# Patient Record
Sex: Female | Born: 1970 | Race: Black or African American | Hispanic: No | Marital: Single | Smoking: Never smoker
Health system: Southern US, Community
[De-identification: ages and names within clinical notes are randomized; demographics above are authoritative.]

## PROBLEM LIST (undated history)

## (undated) DIAGNOSIS — M549 Dorsalgia, unspecified: Secondary | ICD-10-CM

## (undated) DIAGNOSIS — K219 Gastro-esophageal reflux disease without esophagitis: Secondary | ICD-10-CM

## (undated) DIAGNOSIS — E039 Hypothyroidism, unspecified: Secondary | ICD-10-CM

## (undated) HISTORY — PX: FINGER TENDON REPAIR: SHX1640

## (undated) HISTORY — PX: ABDOMINAL HYSTERECTOMY: SHX81

## (undated) HISTORY — PX: SHOULDER ARTHROSCOPY W/ SUPERIOR LABRAL ANTERIOR POSTERIOR LESION REPAIR: SHX2402

---

## 2021-05-16 ENCOUNTER — Observation Stay (HOSPITAL_BASED_OUTPATIENT_CLINIC_OR_DEPARTMENT_OTHER)
Admission: EM | Admit: 2021-05-16 | Discharge: 2021-05-18 | Disposition: A | Discharge status: Home / Self Care | Attending: Surgery | Admitting: Surgery

## 2021-05-16 ENCOUNTER — Emergency Department (HOSPITAL_BASED_OUTPATIENT_CLINIC_OR_DEPARTMENT_OTHER)

## 2021-05-16 ENCOUNTER — Encounter (HOSPITAL_BASED_OUTPATIENT_CLINIC_OR_DEPARTMENT_OTHER): Payer: Self-pay | Admitting: *Deleted

## 2021-05-16 ENCOUNTER — Other Ambulatory Visit: Payer: Self-pay

## 2021-05-16 DIAGNOSIS — Z20822 Contact with and (suspected) exposure to covid-19: Secondary | ICD-10-CM | POA: Diagnosis not present

## 2021-05-16 DIAGNOSIS — E039 Hypothyroidism, unspecified: Secondary | ICD-10-CM | POA: Diagnosis not present

## 2021-05-16 DIAGNOSIS — R1030 Lower abdominal pain, unspecified: Secondary | ICD-10-CM | POA: Diagnosis present

## 2021-05-16 DIAGNOSIS — K37 Unspecified appendicitis: Secondary | ICD-10-CM | POA: Diagnosis present

## 2021-05-16 DIAGNOSIS — K3580 Unspecified acute appendicitis: Principal | ICD-10-CM | POA: Diagnosis present

## 2021-05-16 DIAGNOSIS — K3589 Other acute appendicitis without perforation or gangrene: Secondary | ICD-10-CM

## 2021-05-16 HISTORY — DX: Gastro-esophageal reflux disease without esophagitis: K21.9

## 2021-05-16 HISTORY — DX: Hypothyroidism, unspecified: E03.9

## 2021-05-16 HISTORY — DX: Dorsalgia, unspecified: M54.9

## 2021-05-16 LAB — URINALYSIS, ROUTINE W REFLEX MICROSCOPIC
Bilirubin Urine: NEGATIVE
Glucose, UA: NEGATIVE mg/dL
Ketones, ur: NEGATIVE mg/dL
Nitrite: NEGATIVE
Protein, ur: NEGATIVE mg/dL
Specific Gravity, Urine: 1.025 (ref 1.005–1.030)
pH: 6 (ref 5.0–8.0)

## 2021-05-16 LAB — CBC
HCT: 41 % (ref 36.0–46.0)
Hemoglobin: 13.3 g/dL (ref 12.0–15.0)
MCH: 25.6 pg — ABNORMAL LOW (ref 26.0–34.0)
MCHC: 32.4 g/dL (ref 30.0–36.0)
MCV: 79 fL — ABNORMAL LOW (ref 80.0–100.0)
Platelets: 354 10*3/uL (ref 150–400)
RBC: 5.19 MIL/uL — ABNORMAL HIGH (ref 3.87–5.11)
RDW: 14.8 % (ref 11.5–15.5)
WBC: 18.6 10*3/uL — ABNORMAL HIGH (ref 4.0–10.5)
nRBC: 0 % (ref 0.0–0.2)

## 2021-05-16 LAB — COMPREHENSIVE METABOLIC PANEL
ALT: 14 U/L (ref 0–44)
AST: 18 U/L (ref 15–41)
Albumin: 4.2 g/dL (ref 3.5–5.0)
Alkaline Phosphatase: 106 U/L (ref 38–126)
Anion gap: 8 (ref 5–15)
BUN: 10 mg/dL (ref 6–20)
CO2: 26 mmol/L (ref 22–32)
Calcium: 9.4 mg/dL (ref 8.9–10.3)
Chloride: 103 mmol/L (ref 98–111)
Creatinine, Ser: 0.96 mg/dL (ref 0.44–1.00)
GFR, Estimated: >60 mL/min (ref >60)
Glucose, Bld: 122 mg/dL — ABNORMAL HIGH (ref 70–99)
Potassium: 4.1 mmol/L (ref 3.5–5.1)
Sodium: 137 mmol/L (ref 135–145)
Total Bilirubin: 0.4 mg/dL (ref 0.3–1.2)
Total Protein: 8.7 g/dL — ABNORMAL HIGH (ref 6.5–8.1)

## 2021-05-16 LAB — URINALYSIS, MICROSCOPIC (REFLEX)

## 2021-05-16 LAB — RESP PANEL BY RT-PCR (FLU A&B, COVID) ARPGX2
Influenza A by PCR: NEGATIVE
Influenza B by PCR: NEGATIVE
SARS Coronavirus 2 by RT PCR: NEGATIVE

## 2021-05-16 LAB — LIPASE, BLOOD: Lipase: 23 U/L (ref 11–51)

## 2021-05-16 MED ORDER — IOHEXOL 350 MG/ML SOLN
85.0000 mL | Freq: Once | INTRAVENOUS | Status: AC | PRN
  Administered 2021-05-16: 85 mL via INTRAVENOUS

## 2021-05-16 MED ORDER — MORPHINE SULFATE (PF) 4 MG/ML IV SOLN
4.0000 mg | INTRAVENOUS | Status: DC | PRN
  Administered 2021-05-16 – 2021-05-17 (×2): 4 mg via INTRAVENOUS
  Filled 2021-05-16 (×2): qty 1

## 2021-05-16 MED ORDER — MORPHINE SULFATE (PF) 4 MG/ML IV SOLN
4.0000 mg | Freq: Once | INTRAVENOUS | Status: AC
  Administered 2021-05-16: 4 mg via INTRAVENOUS
  Filled 2021-05-16: qty 1

## 2021-05-16 MED ORDER — SODIUM CHLORIDE 0.9 % IV SOLN
2.0000 g | Freq: Once | INTRAVENOUS | Status: AC
  Administered 2021-05-16: 2 g via INTRAVENOUS
  Filled 2021-05-16: qty 20

## 2021-05-16 MED ORDER — ONDANSETRON HCL 4 MG/2ML IJ SOLN
4.0000 mg | Freq: Once | INTRAMUSCULAR | Status: AC
Start: 2021-05-16 — End: 2021-05-16
  Administered 2021-05-16: 4 mg via INTRAVENOUS
  Filled 2021-05-16: qty 2

## 2021-05-16 MED ORDER — SODIUM CHLORIDE 0.9 % IV BOLUS
1000.0000 mL | Freq: Once | INTRAVENOUS | Status: AC
  Administered 2021-05-16: 1000 mL via INTRAVENOUS

## 2021-05-16 MED ORDER — KETOROLAC TROMETHAMINE 30 MG/ML IJ SOLN: 30.0000 mg | Freq: Once | INTRAMUSCULAR | Status: DC

## 2021-05-16 MED ORDER — METRONIDAZOLE 500 MG/100ML IV SOLN
500.0000 mg | Freq: Once | INTRAVENOUS | Status: AC
Start: 2021-05-16 — End: 2021-05-16
  Administered 2021-05-16: 500 mg via INTRAVENOUS
  Filled 2021-05-16: qty 100

## 2021-05-16 NOTE — ED Triage Notes (Signed)
C/o left lower abd pain n/v x 12 hrs , sent here by PMD for ct scan

## 2021-05-16 NOTE — ED Provider Notes (Addendum)
[facility] EMERGENCY DEPARTMENT Provider Note   CSN: [id number] Arrival date & time: 05/16/21  1650     History Chief Complaint  Patient presents with   Abdominal Pain    Christina Hudson is a 50 y.o. female.  HPI  Patient presents with lower abdominal pain.  This started acutely around 5 AM, its been constant throughout the day.  Pain is periumbilical, does not radiate anywhere.  Is been worsening with time.  She said 2 episodes of emesis, she does feel nauseated.  Patient is always constipated, takes Linzess.  Denies any diarrhea.  She is status post hysterectomy and partial nephrectomy.  Denies any other abdominal surgeries.  She is passing gas.  Moving is an aggravating factor, denies any alleviating factors thus far.  Patient reports she has had 2 similar episodes in the past, once in May and once in August.  She says that they lasted for 24 hours and resolved without intervention.  She was not seen for either these episodes.  Patient went to urgent care earlier today, was sent from the urgent care to the emergency department for CT scan.  Past Medical History:  Diagnosis Date   Back pain    GERD (gastroesophageal reflux disease)    Hypothyroid     There are no problems to display for this patient.   Past Surgical History:  Procedure Laterality Date   ABDOMINAL HYSTERECTOMY     FINGER TENDON REPAIR     SHOULDER ARTHROSCOPY W/ SUPERIOR LABRAL ANTERIOR POSTERIOR LESION REPAIR       OB History   No obstetric history on file.     No family history on file.  Social History   Tobacco Use   Smoking status: Never   Smokeless tobacco: Never  Substance Use Topics   Alcohol use: Yes    Comment: rare   Drug use: Not Currently    Home Medications Prior to Admission medications   Not on File    Allergies    Patient has no known allergies.  Review of Systems   Review of Systems  Constitutional:  Negative for chills, fatigue and fever.  HENT:   Negative for ear pain and sore throat.   Eyes:  Negative for pain and visual disturbance.  Respiratory:  Negative for cough and shortness of breath.   Cardiovascular:  Negative for chest pain and palpitations.  Gastrointestinal:  Positive for abdominal pain, constipation, nausea and vomiting.  Genitourinary:  Negative for dysuria and hematuria.  Musculoskeletal:  Negative for arthralgias, back pain, gait problem, joint swelling and myalgias.  Skin:  Negative for color change and rash.  Neurological:  Negative for seizures and syncope.  All other systems reviewed and are negative.  Physical Exam Updated Vital Signs BP 130/85   Pulse 76   Temp 98.2 F (36.8 C) (Oral)   Resp 16   Ht 5' 4.5" (1.638 m)   Wt 93 kg   SpO2 100%   BMI 34.64 kg/m   Physical Exam Vitals and nursing note reviewed. Exam conducted with a chaperone present.  Constitutional:      General: She is not in acute distress.    Appearance: Normal appearance.     Comments: Patient appears comfortable.  HENT:     Head: Normocephalic and atraumatic.  Eyes:     General: No scleral icterus.       Right eye: No discharge.        Left eye: No discharge.  Extraocular Movements: Extraocular movements intact.     Pupils: Pupils are equal, round, and reactive to light.  Cardiovascular:     Rate and Rhythm: Normal rate and regular rhythm.     Pulses: Normal pulses.     Heart sounds: Normal heart sounds. No murmur heard.   No friction rub. No gallop.  Pulmonary:     Effort: Pulmonary effort is normal. No respiratory distress.     Breath sounds: Normal breath sounds.  Abdominal:     General: Abdomen is flat. Bowel sounds are normal. There is no distension.     Palpations: Abdomen is soft.     Tenderness: There is abdominal tenderness in the periumbilical area. There is no guarding or rebound.     Comments: Abdomen is soft, very tender to the periumbilical area.  Without any rigidity or guarding.  Skin:    General:  Skin is warm and dry.     Coloration: Skin is not jaundiced.  Neurological:     Mental Status: She is alert. Mental status is at baseline.     Coordination: Coordination normal.    ED Results / Procedures / Treatments   Labs (all labs ordered are listed, but only abnormal results are displayed) Labs Reviewed  COMPREHENSIVE METABOLIC PANEL - Abnormal; Notable for the following components:      Result Value   Glucose, Bld 122 (*)    Total Protein 8.7 (*)    All other components within normal limits  CBC - Abnormal; Notable for the following components:   WBC 18.6 (*)    RBC 5.19 (*)    MCV 79.0 (*)    MCH 25.6 (*)    All other components within normal limits  URINALYSIS, ROUTINE W REFLEX MICROSCOPIC - Abnormal; Notable for the following components:   APPearance HAZY (*)    Hgb urine dipstick SMALL (*)    Leukocytes,Ua SMALL (*)    All other components within normal limits  URINALYSIS, MICROSCOPIC (REFLEX) - Abnormal; Notable for the following components:   Bacteria, UA RARE (*)    All other components within normal limits  LIPASE, BLOOD    EKG None  Radiology No results found.  Procedures Procedures   Medications Ordered in ED Medications  ketorolac (TORADOL) 30 MG/ML injection 30 mg (has no administration in time range)    ED Course  I have reviewed the triage vital signs and the nursing notes.  Pertinent labs & imaging results that were available during my care of the patient were reviewed by me and considered in my medical decision making (see chart for details).    MDM Rules/Calculators/A&P                           Patient vitals are stable, she has a very tender to suprapubic area.  She does have a leukocytosis of 18, will proceed with CT abdomen to evaluate for intra-abdominal pathology.  She did not have any dysuria or hematuria, has post hysterectomy so doubt any gynecologic involvement.    Patient has an early appendicitis.  No perforation or  abscess.  Spoke with Dr. Marin Olp with general surgery, patient to be admitted to [facility].  Patient started on Rocephin and Flagyl.  NPO.  Patient is on blood thinners, last meal was yesterday.  No bed availability at [facility], spoke with Dr. Sheliah Hatch who will see the patient at [facility]  Final Clinical Impression(s) / ED Diagnoses  Final diagnoses:  None    Rx / DC Orders ED Discharge Orders     None        Theron Arista, PA-C 05/16/21 2106    Theron Arista, PA-C 05/16/21 2324    Milagros Loll, MD 05/20/21 [phone]

## 2021-05-17 ENCOUNTER — Encounter (HOSPITAL_COMMUNITY): Payer: Self-pay

## 2021-05-17 ENCOUNTER — Encounter (HOSPITAL_COMMUNITY)
Admission: EM | Disposition: A | Discharge status: Home / Self Care | Payer: Self-pay | Source: Home / Self Care | Attending: Emergency Medicine

## 2021-05-17 ENCOUNTER — Observation Stay (HOSPITAL_COMMUNITY): Admitting: Anesthesiology

## 2021-05-17 HISTORY — PX: LAPAROSCOPIC APPENDECTOMY: SHX408

## 2021-05-17 LAB — CBC
HCT: 37.4 % (ref 36.0–46.0)
Hemoglobin: 12 g/dL (ref 12.0–15.0)
MCH: 25.5 pg — ABNORMAL LOW (ref 26.0–34.0)
MCHC: 32.1 g/dL (ref 30.0–36.0)
MCV: 79.4 fL — ABNORMAL LOW (ref 80.0–100.0)
Platelets: 321 10*3/uL (ref 150–400)
RBC: 4.71 MIL/uL (ref 3.87–5.11)
RDW: 14.9 % (ref 11.5–15.5)
WBC: 17.5 10*3/uL — ABNORMAL HIGH (ref 4.0–10.5)
nRBC: 0 % (ref 0.0–0.2)

## 2021-05-17 LAB — COMPREHENSIVE METABOLIC PANEL
ALT: 11 U/L (ref 0–44)
AST: 16 U/L (ref 15–41)
Albumin: 3.4 g/dL — ABNORMAL LOW (ref 3.5–5.0)
Alkaline Phosphatase: 92 U/L (ref 38–126)
Anion gap: 9 (ref 5–15)
BUN: 7 mg/dL (ref 6–20)
CO2: 24 mmol/L (ref 22–32)
Calcium: 8.5 mg/dL — ABNORMAL LOW (ref 8.9–10.3)
Chloride: 104 mmol/L (ref 98–111)
Creatinine, Ser: 0.97 mg/dL (ref 0.44–1.00)
GFR, Estimated: >60 mL/min (ref >60)
Glucose, Bld: 118 mg/dL — ABNORMAL HIGH (ref 70–99)
Potassium: 3.3 mmol/L — ABNORMAL LOW (ref 3.5–5.1)
Sodium: 137 mmol/L (ref 135–145)
Total Bilirubin: 0.5 mg/dL (ref 0.3–1.2)
Total Protein: 7.3 g/dL (ref 6.5–8.1)

## 2021-05-17 LAB — SURGICAL PCR SCREEN
MRSA, PCR: NEGATIVE
Staphylococcus aureus: NEGATIVE

## 2021-05-17 LAB — HIV ANTIBODY (ROUTINE TESTING W REFLEX): HIV Screen 4th Generation wRfx: NONREACTIVE

## 2021-05-17 SURGERY — APPENDECTOMY, LAPAROSCOPIC
Anesthesia: General | Site: Abdomen

## 2021-05-17 MED ORDER — LIDOCAINE HCL (PF) 2 % IJ SOLN
INTRAMUSCULAR | Status: AC
  Filled 2021-05-17: qty 5

## 2021-05-17 MED ORDER — PROPOFOL 10 MG/ML IV BOLUS
INTRAVENOUS | Status: DC | PRN
  Administered 2021-05-17: 150 mg via INTRAVENOUS

## 2021-05-17 MED ORDER — SCOPOLAMINE 1 MG/3DAYS TD PT72
MEDICATED_PATCH | TRANSDERMAL | Status: AC
  Administered 2021-05-17: 1.5 mg via TRANSDERMAL
  Filled 2021-05-17: qty 1

## 2021-05-17 MED ORDER — CHLORHEXIDINE GLUCONATE 0.12 % MT SOLN
OROMUCOSAL | Status: AC
  Administered 2021-05-17: 15 mL via OROMUCOSAL
  Filled 2021-05-17: qty 15

## 2021-05-17 MED ORDER — ACETAMINOPHEN 650 MG RE SUPP: 650.0000 mg | Freq: Four times a day (QID) | RECTAL | Status: DC | PRN

## 2021-05-17 MED ORDER — BUPIVACAINE-EPINEPHRINE 0.5% -1:200000 IJ SOLN
INTRAMUSCULAR | Status: DC | PRN
  Administered 2021-05-17: 20 mL

## 2021-05-17 MED ORDER — LIDOCAINE 2% (20 MG/ML) 5 ML SYRINGE
INTRAMUSCULAR | Status: DC | PRN
  Administered 2021-05-17: 60 mg via INTRAVENOUS

## 2021-05-17 MED ORDER — MIDAZOLAM HCL 5 MG/5ML IJ SOLN
INTRAMUSCULAR | Status: DC | PRN
  Administered 2021-05-17 (×2): 2 mg via INTRAVENOUS

## 2021-05-17 MED ORDER — CEFTRIAXONE SODIUM 2 G IJ SOLR
2.0000 g | INTRAMUSCULAR | Status: DC
  Filled 2021-05-17: qty 20

## 2021-05-17 MED ORDER — FENTANYL CITRATE (PF) 250 MCG/5ML IJ SOLN
INTRAMUSCULAR | Status: AC
  Filled 2021-05-17: qty 5

## 2021-05-17 MED ORDER — MIDAZOLAM HCL 2 MG/2ML IJ SOLN
INTRAMUSCULAR | Status: AC
  Filled 2021-05-17: qty 2

## 2021-05-17 MED ORDER — MORPHINE SULFATE (PF) 2 MG/ML IV SOLN: 2.0000 mg | INTRAVENOUS | Status: DC | PRN

## 2021-05-17 MED ORDER — BUPIVACAINE-EPINEPHRINE 0.5% -1:200000 IJ SOLN
INTRAMUSCULAR | Status: AC
  Filled 2021-05-17: qty 1

## 2021-05-17 MED ORDER — ACETAMINOPHEN 500 MG PO TABS
ORAL_TABLET | ORAL | Status: AC
  Administered 2021-05-17: 1000 mg via ORAL
  Filled 2021-05-17: qty 2

## 2021-05-17 MED ORDER — FENTANYL CITRATE (PF) 250 MCG/5ML IJ SOLN
INTRAMUSCULAR | Status: DC | PRN
  Administered 2021-05-17 (×2): 50 ug via INTRAVENOUS
  Administered 2021-05-17: 100 ug via INTRAVENOUS

## 2021-05-17 MED ORDER — DEXAMETHASONE SODIUM PHOSPHATE 10 MG/ML IJ SOLN
INTRAMUSCULAR | Status: AC
  Filled 2021-05-17: qty 1

## 2021-05-17 MED ORDER — CHLORHEXIDINE GLUCONATE 0.12 % MT SOLN: 15.0000 mL | Freq: Once | OROMUCOSAL | Status: AC

## 2021-05-17 MED ORDER — SCOPOLAMINE 1 MG/3DAYS TD PT72: 1.0000 | MEDICATED_PATCH | TRANSDERMAL | Status: DC

## 2021-05-17 MED ORDER — PROMETHAZINE HCL 25 MG/ML IJ SOLN: 6.2500 mg | INTRAMUSCULAR | Status: DC | PRN

## 2021-05-17 MED ORDER — PROPOFOL 10 MG/ML IV BOLUS
INTRAVENOUS | Status: AC
  Filled 2021-05-17: qty 20

## 2021-05-17 MED ORDER — OXYCODONE HCL 5 MG PO TABS
5.0000 mg | ORAL_TABLET | Freq: Four times a day (QID) | ORAL | Status: DC | PRN
  Administered 2021-05-17 – 2021-05-18 (×3): 5 mg via ORAL
  Filled 2021-05-17 (×3): qty 1

## 2021-05-17 MED ORDER — ROCURONIUM BROMIDE 10 MG/ML (PF) SYRINGE
PREFILLED_SYRINGE | INTRAVENOUS | Status: DC | PRN
  Administered 2021-05-17: 80 mg via INTRAVENOUS

## 2021-05-17 MED ORDER — AMISULPRIDE (ANTIEMETIC) 5 MG/2ML IV SOLN: 10.0000 mg | Freq: Once | INTRAVENOUS | Status: DC | PRN

## 2021-05-17 MED ORDER — ONDANSETRON HCL 4 MG/2ML IJ SOLN
INTRAMUSCULAR | Status: AC
  Filled 2021-05-17: qty 2

## 2021-05-17 MED ORDER — LACTATED RINGERS IV SOLN: INTRAVENOUS | Status: DC

## 2021-05-17 MED ORDER — ONDANSETRON 4 MG PO TBDP: 4.0000 mg | ORAL_TABLET | Freq: Four times a day (QID) | ORAL | Status: DC | PRN

## 2021-05-17 MED ORDER — METRONIDAZOLE 500 MG/100ML IV SOLN
500.0000 mg | Freq: Two times a day (BID) | INTRAVENOUS | Status: DC
  Administered 2021-05-17 – 2021-05-18 (×2): 500 mg via INTRAVENOUS
  Filled 2021-05-17: qty 100

## 2021-05-17 MED ORDER — ORAL CARE MOUTH RINSE: 15.0000 mL | Freq: Once | OROMUCOSAL | Status: AC

## 2021-05-17 MED ORDER — ONDANSETRON HCL 4 MG/2ML IJ SOLN: 4.0000 mg | Freq: Four times a day (QID) | INTRAMUSCULAR | Status: DC | PRN

## 2021-05-17 MED ORDER — POLYETHYLENE GLYCOL 3350 17 G PO PACK: 17.0000 g | PACK | Freq: Every day | ORAL | Status: DC | PRN

## 2021-05-17 MED ORDER — DEXMEDETOMIDINE (PRECEDEX) IN NS 20 MCG/5ML (4 MCG/ML) IV SYRINGE
PREFILLED_SYRINGE | INTRAVENOUS | Status: AC
  Filled 2021-05-17: qty 5

## 2021-05-17 MED ORDER — BUPIVACAINE-EPINEPHRINE (PF) 0.25% -1:200000 IJ SOLN
INTRAMUSCULAR | Status: AC
  Filled 2021-05-17: qty 30

## 2021-05-17 MED ORDER — HYDROMORPHONE HCL 1 MG/ML IJ SOLN: 0.2500 mg | INTRAMUSCULAR | Status: DC | PRN

## 2021-05-17 MED ORDER — PHENYLEPHRINE 40 MCG/ML (10ML) SYRINGE FOR IV PUSH (FOR BLOOD PRESSURE SUPPORT)
PREFILLED_SYRINGE | INTRAVENOUS | Status: AC
  Filled 2021-05-17: qty 10

## 2021-05-17 MED ORDER — SUGAMMADEX SODIUM 200 MG/2ML IV SOLN
INTRAVENOUS | Status: DC | PRN
  Administered 2021-05-17: 50 mg via INTRAVENOUS
  Administered 2021-05-17: 200 mg via INTRAVENOUS

## 2021-05-17 MED ORDER — PHENYLEPHRINE 40 MCG/ML (10ML) SYRINGE FOR IV PUSH (FOR BLOOD PRESSURE SUPPORT)
PREFILLED_SYRINGE | INTRAVENOUS | Status: DC | PRN
  Administered 2021-05-17: 80 ug via INTRAVENOUS

## 2021-05-17 MED ORDER — ACETAMINOPHEN 325 MG PO TABS: 650.0000 mg | ORAL_TABLET | Freq: Four times a day (QID) | ORAL | Status: DC | PRN

## 2021-05-17 MED ORDER — ONDANSETRON HCL 4 MG/2ML IJ SOLN
INTRAMUSCULAR | Status: DC | PRN
  Administered 2021-05-17: 4 mg via INTRAVENOUS

## 2021-05-17 MED ORDER — KETOROLAC TROMETHAMINE 30 MG/ML IJ SOLN: 30.0000 mg | Freq: Once | INTRAMUSCULAR | Status: DC | PRN

## 2021-05-17 MED ORDER — ENOXAPARIN SODIUM 40 MG/0.4ML IJ SOSY
40.0000 mg | PREFILLED_SYRINGE | INTRAMUSCULAR | Status: DC
  Administered 2021-05-18: 40 mg via SUBCUTANEOUS
  Filled 2021-05-17: qty 0.4

## 2021-05-17 MED ORDER — DEXMEDETOMIDINE (PRECEDEX) IN NS 20 MCG/5ML (4 MCG/ML) IV SYRINGE
PREFILLED_SYRINGE | INTRAVENOUS | Status: DC | PRN
  Administered 2021-05-17 (×3): 10 ug via INTRAVENOUS

## 2021-05-17 MED ORDER — OXYCODONE HCL 5 MG/5ML PO SOLN
5.0000 mg | Freq: Once | ORAL | Status: DC | PRN
Start: 2021-05-17 — End: 2021-05-17

## 2021-05-17 MED ORDER — MEPERIDINE HCL 25 MG/ML IJ SOLN: 6.2500 mg | INTRAMUSCULAR | Status: DC | PRN

## 2021-05-17 MED ORDER — 0.9 % SODIUM CHLORIDE (POUR BTL) OPTIME
TOPICAL | Status: DC | PRN
  Administered 2021-05-17: 1000 mL

## 2021-05-17 MED ORDER — ACETAMINOPHEN 500 MG PO TABS: 1000.0000 mg | ORAL_TABLET | Freq: Once | ORAL | Status: AC

## 2021-05-17 MED ORDER — DEXAMETHASONE SODIUM PHOSPHATE 10 MG/ML IJ SOLN
INTRAMUSCULAR | Status: DC | PRN
  Administered 2021-05-17: 10 mg via INTRAVENOUS

## 2021-05-17 MED ORDER — KCL IN DEXTROSE-NACL 20-5-0.45 MEQ/L-%-% IV SOLN
INTRAVENOUS | Status: DC
  Filled 2021-05-17 (×2): qty 1000

## 2021-05-17 MED ORDER — OXYCODONE HCL 5 MG PO TABS: 5.0000 mg | ORAL_TABLET | Freq: Once | ORAL | Status: DC | PRN

## 2021-05-17 MED ORDER — SODIUM CHLORIDE 0.9 % IR SOLN
Status: DC | PRN
  Administered 2021-05-17: 1000 mL

## 2021-05-17 MED ORDER — METRONIDAZOLE 500 MG/100ML IV SOLN
INTRAVENOUS | Status: AC
  Filled 2021-05-17: qty 100

## 2021-05-17 MED ORDER — METOPROLOL TARTRATE 5 MG/5ML IV SOLN: 5.0000 mg | Freq: Four times a day (QID) | INTRAVENOUS | Status: DC | PRN

## 2021-05-17 MED ORDER — ROCURONIUM BROMIDE 10 MG/ML (PF) SYRINGE
PREFILLED_SYRINGE | INTRAVENOUS | Status: AC
  Filled 2021-05-17: qty 10

## 2021-05-17 SURGICAL SUPPLY — 37 items
APPLIER CLIP 5 13 M/L LIGAMAX5 (MISCELLANEOUS)
APPLIER CLIP ROT 10 11.4 M/L (STAPLE)
BAG COUNTER SPONGE SURGICOUNT (BAG) ×2 IMPLANT
CANISTER SUCT 3000ML PPV (MISCELLANEOUS) ×2 IMPLANT
CHLORAPREP W/TINT 26 (MISCELLANEOUS) ×2 IMPLANT
CLIP APPLIE 5 13 M/L LIGAMAX5 (MISCELLANEOUS) IMPLANT
CLIP APPLIE ROT 10 11.4 M/L (STAPLE) IMPLANT
COVER SURGICAL LIGHT HANDLE (MISCELLANEOUS) ×2 IMPLANT
CUTTER FLEX LINEAR 45M (STAPLE) IMPLANT
DERMABOND ADVANCED (GAUZE/BANDAGES/DRESSINGS) ×1
DERMABOND ADVANCED .7 DNX12 (GAUZE/BANDAGES/DRESSINGS) ×1 IMPLANT
ELECT REM PT RETURN 9FT ADLT (ELECTROSURGICAL) ×2
ELECTRODE REM PT RTRN 9FT ADLT (ELECTROSURGICAL) ×1 IMPLANT
GLOVE SURG SIGNA 7.5 PF LTX (GLOVE) ×2 IMPLANT
GOWN STRL REUS W/ TWL LRG LVL3 (GOWN DISPOSABLE) ×2 IMPLANT
GOWN STRL REUS W/ TWL XL LVL3 (GOWN DISPOSABLE) ×1 IMPLANT
GOWN STRL REUS W/TWL LRG LVL3 (GOWN DISPOSABLE) ×2
GOWN STRL REUS W/TWL XL LVL3 (GOWN DISPOSABLE) ×1
KIT BASIN OR (CUSTOM PROCEDURE TRAY) ×2 IMPLANT
KIT TURNOVER KIT B (KITS) ×2 IMPLANT
NS IRRIG 1000ML POUR BTL (IV SOLUTION) ×2 IMPLANT
PAD ARMBOARD 7.5X6 YLW CONV (MISCELLANEOUS) ×4 IMPLANT
POUCH SPECIMEN RETRIEVAL 10MM (ENDOMECHANICALS) ×2 IMPLANT
RELOAD 45 VASCULAR/THIN (ENDOMECHANICALS) IMPLANT
RELOAD STAPLE TA45 3.5 REG BLU (ENDOMECHANICALS) ×2 IMPLANT
SET IRRIG TUBING LAPAROSCOPIC (IRRIGATION / IRRIGATOR) ×2 IMPLANT
SET TUBE SMOKE EVAC HIGH FLOW (TUBING) ×2 IMPLANT
SHEARS HARMONIC ACE PLUS 36CM (ENDOMECHANICALS) ×2 IMPLANT
SLEEVE ENDOPATH XCEL 5M (ENDOMECHANICALS) ×2 IMPLANT
SPECIMEN JAR SMALL (MISCELLANEOUS) ×2 IMPLANT
SUT MON AB 4-0 PC3 18 (SUTURE) ×2 IMPLANT
TOWEL GREEN STERILE (TOWEL DISPOSABLE) ×2 IMPLANT
TOWEL GREEN STERILE FF (TOWEL DISPOSABLE) ×2 IMPLANT
TRAY LAPAROSCOPIC MC (CUSTOM PROCEDURE TRAY) ×2 IMPLANT
TROCAR XCEL BLUNT TIP 100MML (ENDOMECHANICALS) ×2 IMPLANT
TROCAR XCEL NON-BLD 5MMX100MML (ENDOMECHANICALS) ×2 IMPLANT
WATER STERILE IRR 1000ML POUR (IV SOLUTION) ×2 IMPLANT

## 2021-05-17 NOTE — Discharge Instructions (Signed)
[facility]  Please arrive at least 30 min before your appointment to complete your check in paperwork.  If you are unable to arrive 30 min prior to your appointment time we may have to cancel or reschedule you. LAPAROSCOPIC SURGERY: POST OP INSTRUCTIONS Always review your discharge instruction sheet given to you by the facility where your surgery was performed. IF YOU HAVE DISABILITY OR FAMILY LEAVE FORMS, YOU MUST BRING THEM TO THE OFFICE FOR PROCESSING.   DO NOT GIVE THEM TO YOUR DOCTOR.  PAIN CONTROL  First take acetaminophen (Tylenol) AND/or ibuprofen (Advil) to control your pain after surgery.  Follow directions on package.  Taking acetaminophen (Tylenol) and/or ibuprofen (Advil) regularly after surgery will help to control your pain and lower the amount of prescription pain medication you may need.  You should not take more than 4,000 mg (4 grams) of acetaminophen (Tylenol) in 24 hours.  You should not take ibuprofen (Advil), aleve, motrin, naprosyn or other NSAIDS if you have a history of stomach ulcers or chronic kidney disease.  A prescription for pain medication may be given to you upon discharge.  Take your pain medication as prescribed, if you still have uncontrolled pain after taking acetaminophen (Tylenol) or ibuprofen (Advil). Use ice packs to help control pain. If you need a refill on your pain medication, please contact your pharmacy.  They will contact our office to request authorization. Prescriptions will not be filled after 5pm or on week-ends.  HOME MEDICATIONS Take your usually prescribed medications unless otherwise directed.  DIET You should follow a light diet the first few days after arrival home.  Be sure to include lots of fluids daily. Avoid fatty, fried foods.   CONSTIPATION It is common to experience some constipation after surgery and if you are taking pain medication.  Increasing fluid intake and taking a stool softener (such as Colace)  will usually help or prevent this problem from occurring.  A mild laxative (Milk of Magnesia or Miralax) should be taken according to package instructions if there are no bowel movements after 48 hours.  WOUND/INCISION CARE Most patients will experience some swelling and bruising in the area of the incisions.  Ice packs will help.  Swelling and bruising can take several days to resolve.  Unless discharge instructions indicate otherwise, follow guidelines below  STERI-STRIPS - you may remove your outer bandages 48 hours after surgery, and you may shower at that time.  You have steri-strips (small skin tapes) in place directly over the incision.  These strips should be left on the skin for 7-10 days.   DERMABOND/SKIN GLUE - you may shower in 24 hours.  The glue will flake off over the next 2-3 weeks. Any sutures or staples will be removed at the office during your follow-up visit.  ACTIVITIES You may resume regular (light) daily activities beginning the next day--such as daily self-care, walking, climbing stairs--gradually increasing activities as tolerated.  You may have sexual intercourse when it is comfortable.  Refrain from any heavy lifting or straining until approved by your doctor. You may drive when you are no longer taking prescription pain medication, you can comfortably wear a seatbelt, and you can safely maneuver your car and apply brakes.  FOLLOW-UP You should see your doctor in the office for a follow-up appointment approximately 2-3 weeks after your surgery.  You should have been given your post-op/follow-up appointment when your surgery was scheduled.  If you did not receive a post-op/follow-up appointment, make sure   that you call for this appointment within a day or two after you arrive home to insure a convenient appointment time.   WHEN TO CALL YOUR DOCTOR: Fever over 101.0 Inability to urinate Continued bleeding from incision. Increased pain, redness, or drainage from the  incision. Increasing abdominal pain  The clinic staff is available to answer your questions during regular business hours.  Please don't hesitate to call and ask to speak to one of the nurses for clinical concerns.  If you have a medical emergency, go to the nearest emergency room or call 911.  A surgeon from [facility] is always on call at the hospital. [address], [address], [place], [place]  [address] ? [address] [address], [place], [place]   [address] [phone] ? [phone] ? FAX [phone]  

## 2021-05-17 NOTE — Anesthesia Postprocedure Evaluation (Signed)
Anesthesia Post Note  Patient: Christina Hudson  Procedure(s) Performed: APPENDECTOMY LAPAROSCOPIC (Abdomen)     Patient location during evaluation: PACU Anesthesia Type: General Level of consciousness: awake and alert, oriented and patient cooperative Pain management: pain level controlled Vital Signs Assessment: post-procedure vital signs reviewed and stable Respiratory status: spontaneous breathing, nonlabored ventilation and respiratory function stable Cardiovascular status: blood pressure returned to baseline and stable Postop Assessment: no apparent nausea or vomiting Anesthetic complications: no   No notable events documented.  Last Vitals:  Vitals:   05/17/21 1015 05/17/21 1240  BP: (!) 111/49 128/67  Pulse: 75 80  Resp: 16 20  Temp: 36.6 C (!) 36.1 C  SpO2: 97% 96%    Last Pain:  Vitals:   05/17/21 1015  TempSrc: Oral  PainSc: 5                  Lannie Fields

## 2021-05-17 NOTE — Transfer of Care (Signed)
Immediate Anesthesia Transfer of Care Note  Patient: Christina Hudson  Procedure(s) Performed: APPENDECTOMY LAPAROSCOPIC (Abdomen)  Patient Location: PACU  Anesthesia Type:General  Level of Consciousness: oriented, drowsy and patient cooperative  Airway & Oxygen Therapy: Patient Spontanous Breathing and Patient connected to nasal cannula oxygen  Post-op Assessment: Report given to RN and Post -op Vital signs reviewed and stable  Post vital signs: Reviewed  Last Vitals:  Vitals Value Taken Time  BP 128/67 05/17/21 1239  Temp    Pulse 80 05/17/21 1244  Resp 15 05/17/21 1244  SpO2 96 % 05/17/21 1244  Vitals shown include unvalidated device data.  Last Pain:  Vitals:   05/17/21 1015  TempSrc: Oral  PainSc: 5       Patients Stated Pain Goal: 2 (05/17/21 1015)  Complications: No notable events documented.

## 2021-05-17 NOTE — Anesthesia Procedure Notes (Addendum)
Procedure Name: Intubation Date/Time: 05/17/2021 11:29 AM Performed by: Lovie Chol, CRNA Pre-anesthesia Checklist: Patient identified, Emergency Drugs available, Suction available and Patient being monitored Patient Re-evaluated:Patient Re-evaluated prior to induction Oxygen Delivery Method: Circle System Utilized Preoxygenation: Pre-oxygenation with 100% oxygen Induction Type: IV induction Ventilation: Mask ventilation without difficulty Laryngoscope Size: Miller and 3 Grade View: Grade I Tube type: Oral Tube size: 7.0 mm Number of attempts: 1 Airway Equipment and Method: Stylet Placement Confirmation: ETT inserted through vocal cords under direct vision, positive ETCO2 and breath sounds checked- equal and bilateral Secured at: 21 cm Tube secured with: Tape Dental Injury: Teeth and Oropharynx as per pre-operative assessment

## 2021-05-17 NOTE — Anesthesia Preprocedure Evaluation (Addendum)
Anesthesia Evaluation  Patient identified by MRN, date of birth, ID band Patient awake    Reviewed: Allergy & Precautions, NPO status , Patient's Chart, lab work & pertinent test results  Airway Mallampati: III  TM Distance: >3 FB Neck ROM: Full    Dental no notable dental hx. (+) Teeth Intact, Dental Advisory Given   Pulmonary  Snores at night, negative sleep study per pt   Pulmonary exam normal breath sounds clear to auscultation       Cardiovascular negative cardio ROS Normal cardiovascular exam Rhythm:Regular Rate:Normal     Neuro/Psych negative neurological ROS  negative psych ROS   GI/Hepatic GERD  Controlled,(+)     substance abuse  alcohol use, Acute appendicitis   Endo/Other  Hypothyroidism Obesity BMI 37  Renal/GU negative Renal ROS  negative genitourinary   Musculoskeletal negative musculoskeletal ROS (+)   Abdominal (+) + obese,   Peds  Hematology negative hematology ROS (+) hct 37.4   Anesthesia Other Findings   Reproductive/Obstetrics negative OB ROS                            Anesthesia Physical Anesthesia Plan  ASA: 3  Anesthesia Plan: General   Post-op Pain Management:    Induction: Intravenous  PONV Risk Score and Plan: 4 or greater and Ondansetron, Dexamethasone, Midazolam, Scopolamine patch - Pre-op and Treatment may vary due to age or medical condition  Airway Management Planned: Oral ETT  Additional Equipment: None  Intra-op Plan:   Post-operative Plan: Extubation in OR  Informed Consent: I have reviewed the patients History and Physical, chart, labs and discussed the procedure including the risks, benefits and alternatives for the proposed anesthesia with the patient or authorized representative who has indicated his/her understanding and acceptance.     Dental advisory given  Plan Discussed with: CRNA  Anesthesia Plan Comments:         Anesthesia Quick Evaluation

## 2021-05-17 NOTE — H&P (Signed)
Reason for Consult:abdominal pain Referring Provider: Theron Arista  [facility] Christina Hudson is an 50 y.o. female.  HPI: 50 yo female with 1 day of abdominal pain. Pain has been mainly in the left lower quadrant. It does not radiate. It has been constant. It is slightly worse than when it started. It is worse with movement. It is better with pain medication. She also vomited earlier today and so came to the Ed for evaluation.  Past Medical History:  Diagnosis Date   Back pain    GERD (gastroesophageal reflux disease)    Hypothyroid     Past Surgical History:  Procedure Laterality Date   ABDOMINAL HYSTERECTOMY     FINGER TENDON REPAIR     SHOULDER ARTHROSCOPY W/ SUPERIOR LABRAL ANTERIOR POSTERIOR LESION REPAIR      History reviewed. No pertinent family history.  Social History:  reports that she has never smoked. She has never used smokeless tobacco. She reports current alcohol use. She reports that she does not currently use drugs.  Allergies: No Known Allergies  Medications: I have reviewed the patient's current medications.  Results for orders placed or performed during the hospital encounter of 05/16/21 (from the past 48 hour(s))  Urinalysis, Routine w reflex microscopic Urine, Clean Catch     Status: Abnormal   Collection Time: 05/16/21  5:50 PM  Result Value Ref Range   Color, Urine YELLOW YELLOW   APPearance HAZY (A) CLEAR   Specific Gravity, Urine 1.025 1.005 - 1.030   pH 6.0 5.0 - 8.0   Glucose, UA NEGATIVE NEGATIVE mg/dL   Hgb urine dipstick SMALL (A) NEGATIVE   Bilirubin Urine NEGATIVE NEGATIVE   Ketones, ur NEGATIVE NEGATIVE mg/dL   Protein, ur NEGATIVE NEGATIVE mg/dL   Nitrite NEGATIVE NEGATIVE   Leukocytes,Ua SMALL (A) NEGATIVE    Comment: Performed at [facility], [address] [place], [place] [address]  Urinalysis, Microscopic (reflex)     Status: Abnormal   Collection Time: 05/16/21  5:50 PM  Result Value Ref Range   RBC / HPF 0-5 0 - 5  RBC/hpf   WBC, UA 6-10 0 - 5 WBC/hpf   Bacteria, UA RARE (A) NONE SEEN   Squamous Epithelial / LPF 6-10 0 - 5    Comment: Performed at [facility], [address] [place], [place] [address]  Lipase, blood     Status: None   Collection Time: 05/16/21  6:47 PM  Result Value Ref Range   Lipase 23 11 - 51 U/L    Comment: Performed at [facility], [address] [place], [place] [address]  Comprehensive metabolic panel     Status: Abnormal   Collection Time: 05/16/21  6:47 PM  Result Value Ref Range   Sodium 137 135 - 145 mmol/L   Potassium 4.1 3.5 - 5.1 mmol/L   Chloride 103 98 - 111 mmol/L   CO2 26 22 - 32 mmol/L   Glucose, Bld 122 (H) 70 - 99 mg/dL    Comment: Glucose reference range applies only to samples taken after fasting for at least 8 hours.   BUN 10 6 - 20 mg/dL   Creatinine, Ser 7.01 0.44 - 1.00 mg/dL   Calcium 9.4 8.9 - 77.9 mg/dL   Total Protein 8.7 (H) 6.5 - 8.1 g/dL   Albumin 4.2 3.5 - 5.0 g/dL   AST 18 15 - 41 U/L   ALT 14 0 - 44 U/L  Alkaline Phosphatase 106 38 - 126 U/L   Total Bilirubin 0.4 0.3 - 1.2 mg/dL   GFR, Estimated >41 >66 mL/min    Comment: (NOTE) Calculated using the CKD-EPI Creatinine Equation (2021)    Anion gap 8 5 - 15    Comment: Performed at [facility], [address] [place], [place] [address]  CBC     Status: Abnormal   Collection Time: 05/16/21  6:47 PM  Result Value Ref Range   WBC 18.6 (H) 4.0 - 10.5 K/uL   RBC 5.19 (H) 3.87 - 5.11 MIL/uL   Hemoglobin 13.3 12.0 - 15.0 g/dL   HCT 60.1 09.3 - 23.5 %   MCV 79.0 (L) 80.0 - 100.0 fL   MCH 25.6 (L) 26.0 - 34.0 pg   MCHC 32.4 30.0 - 36.0 g/dL   RDW 57.3 22.0 - 25.4 %   Platelets 354 150 - 400 K/uL   nRBC 0.0 0.0 - 0.2 %    Comment: Performed at [facility], [address] [place], [place] [address]  Resp Panel by RT-PCR (Flu A&B, Covid) Nasopharyngeal Swab     Status: None   Collection Time: 05/16/21  9:19 PM   Specimen: Nasopharyngeal  Swab; Nasopharyngeal(NP) swabs in vial transport medium  Result Value Ref Range   SARS Coronavirus 2 by RT PCR NEGATIVE NEGATIVE    Comment: (NOTE) SARS-CoV-2 target nucleic acids are NOT DETECTED.  The SARS-CoV-2 RNA is generally detectable in upper respiratory specimens during the acute phase of infection. The lowest concentration of SARS-CoV-2 viral copies this assay can detect is 138 copies/mL. A negative result does not preclude SARS-Cov-2 infection and should not be used as the sole basis for treatment or other patient management decisions. A negative result may occur with  improper specimen collection/handling, submission of specimen other than nasopharyngeal swab, presence of viral mutation(s) within the areas targeted by this assay, and inadequate number of viral copies(<138 copies/mL). A negative result must be combined with clinical observations, patient history, and epidemiological information. The expected result is Negative.  Fact Sheet for Patients:  BloggerCourse.com  Fact Sheet for Healthcare Providers:  SeriousBroker.it  This test is no t yet approved or cleared by the [place] FDA and  has been authorized for detection and/or diagnosis of SARS-CoV-2 by FDA under an Emergency Use Authorization (EUA). This EUA will remain  in effect (meaning this test can be used) for the duration of the COVID-19 declaration under Section 564(b)(1) of the Act, 21 U.S.C.section 360bbb-3(b)(1), unless the authorization is terminated  or revoked sooner.       Influenza A by PCR NEGATIVE NEGATIVE   Influenza B by PCR NEGATIVE NEGATIVE    Comment: (NOTE) The Xpert Xpress SARS-CoV-2/FLU/RSV plus assay is intended as an aid in the diagnosis of influenza from Nasopharyngeal swab specimens and should not be used as a sole basis for treatment. Nasal washings and aspirates are unacceptable for Xpert Xpress  SARS-CoV-2/FLU/RSV testing.  Fact Sheet for Patients: BloggerCourse.com  Fact Sheet for Healthcare Providers: SeriousBroker.it  This test is not yet approved or cleared by the [place] FDA and has been authorized for detection and/or diagnosis of SARS-CoV-2 by FDA under an Emergency Use Authorization (EUA). This EUA will remain in effect (meaning this test can be used) for the duration of the COVID-19 declaration under Section 564(b)(1) of the Act, 21 U.S.C. section 360bbb-3(b)(1), unless the authorization is terminated or revoked.  Performed at [facility],  [address], [place], [place] [address]     CT ABDOMEN PELVIS W CONTRAST  Result Date: 05/16/2021 CLINICAL DATA:  Abdominal pain. EXAM: CT ABDOMEN AND PELVIS WITH CONTRAST TECHNIQUE: Multidetector CT imaging of the abdomen and pelvis was performed using the standard protocol following bolus administration of intravenous contrast. CONTRAST:  64mL OMNIPAQUE IOHEXOL 350 MG/ML SOLN COMPARISON:  None. FINDINGS: Lower chest: No acute abnormality. Hepatobiliary: A 1.2 cm diameter cystic appearing area is seen within the right lobe of the liver. An additional 5 mm focus of parenchymal low attenuation is noted within the right lobe. This is too small to characterize by CT examination. No gallstones, gallbladder wall thickening, or biliary dilatation. Pancreas: Unremarkable. No pancreatic ductal dilatation or surrounding inflammatory changes. Spleen: Normal in size without focal abnormality. Adrenals/Urinary Tract: Adrenal glands are unremarkable. Kidneys are normal in size, without renal calculi or hydronephrosis. A 5 mm cystic appearing area is noted within the posterior aspect of the mid right kidney. Bladder is unremarkable. Stomach/Bowel: Stomach is within normal limits. A dilated (approximately 1.2 cm), fluid-filled appendix is seen (axial CT images 64 through 68, CT series  2). A trace amount of periappendiceal inflammatory fat stranding is noted. Appendiceal wall thickening is identified. There is no evidence of associated perforation or abscess. No evidence of bowel wall thickening, distention, or inflammatory changes. Vascular/Lymphatic: No significant vascular findings are present. No enlarged abdominal or pelvic lymph nodes. Reproductive: Status post hysterectomy. No adnexal masses. Other: No abdominal wall hernia or abnormality. No abdominopelvic ascites. Musculoskeletal: No acute or significant osseous findings. IMPRESSION: 1. Findings consistent with early/mild appendicitis. 2. Small hepatic cyst versus hemangioma. Electronically Signed   By: Aram Candela M.D.   On: 05/16/2021 20:47    Review of Systems  Constitutional:  Negative for chills and fever.  HENT:  Negative for hearing loss.   Eyes:  Negative for blurred vision and double vision.  Respiratory:  Negative for cough and hemoptysis.   Cardiovascular:  Negative for chest pain and palpitations.  Gastrointestinal:  Positive for abdominal pain, nausea and vomiting.  Genitourinary:  Negative for dysuria and urgency.  Musculoskeletal:  Negative for myalgias and neck pain.  Skin:  Negative for itching and rash.  Neurological:  Negative for dizziness, tingling and headaches.  Endo/Heme/Allergies:  Does not bruise/bleed easily.  Psychiatric/Behavioral:  Negative for depression and suicidal ideas.    PE Blood pressure 103/70, pulse 86, temperature 98.2 F (36.8 C), temperature source Oral, resp. rate 19, height 5' 4.5" (1.638 m), weight 100.4 kg, SpO2 100 %. Constitutional: NAD; conversant; no deformities Eyes: Moist conjunctiva; no lid lag; anicteric; PERRL Neck: Trachea midline; no thyromegaly Lungs: Normal respiratory effort; no tactile fremitus CV: RRR; no palpable thrills; no pitting edema GI: Abd tender to palpation in the left lower quadrant and suprapubic area; no palpable  hepatosplenomegaly MSK: Normal gait; no clubbing/cyanosis Psychiatric: Appropriate affect; alert and oriented x3 Lymphatic: No palpable cervical or axillary lymphadenopathy Skin: No major subcutaneous nodules. Warm and dry   Assessment/Plan: 50 yo female with acute appendicitis -observe -IV abx -lap appendectomy later today -we discussed the details of the procedure; that it would be done under general anesthesia, that we would attempt to do the procedure laparoscopically. That the appendix would be isolated from the large and small intestine and then ligated and removed. We discussed the reason for this is to avoid rupture and infection and resolve the pains. We discussed risks of infection, abscess, injury to intestines or urinary structures,  and need for open incision. She showed good understanding and wanted to proceed.   De Blanch Jes Costales 05/17/2021, 1:37 AM

## 2021-05-17 NOTE — Progress Notes (Signed)
Patient ID: Flavia Shipper, female   DOB: July 03, 1971, 50 y.o.   MRN: [id number]   Pre Procedure note for inpatients:   Vincenta Steffey has been scheduled for Procedure(s): APPENDECTOMY LAPAROSCOPIC (N/A) today. The various methods of treatment have been discussed with the patient. After consideration of the risks, benefits and treatment options the patient has consented to the planned procedure.   The patient has been seen and labs reviewed. There are no changes in the patient's condition to prevent proceeding with the planned procedure today.  Recent labs:  Lab Results  Component Value Date   WBC 17.5 (H) 05/17/2021   HGB 12.0 05/17/2021   HCT 37.4 05/17/2021   PLT 321 05/17/2021   GLUCOSE 118 (H) 05/17/2021   ALT 11 05/17/2021   AST 16 05/17/2021   NA 137 05/17/2021   K 3.3 (L) 05/17/2021   CL 104 05/17/2021   CREATININE 0.97 05/17/2021   BUN 7 05/17/2021   CO2 24 05/17/2021    Abigail Miyamoto, MD 05/17/2021 10:41 AM

## 2021-05-17 NOTE — Op Note (Signed)
Preoperative diagnosis: acute appendicitis  Postoperative diagnosis: Same   Procedure: laparoscopic appendectomy  Surgeon: Abigail Miyamoto, MD  Asst: Aquilla Solian, MD  Anesthesia: Gen.   Indications for procedure: Christina Hudson is a 50 y.o. female with symptoms of pain in right lower quadrant, nausea, and vomiting consistent with acute appendicitis. Confirmed by CT.  Description of procedure: The patient was brought into the operative suite, placed supine. Anesthesia was administered with endotracheal tube. The patient's left arm was tucked. All pressure points were offloaded by foam padding. The patient was prepped and draped in the usual sterile fashion.  A supraumbilical incision was made and a 18mm trocar was used. Pneumoperitoneum was applied with high flow low pressure.  2 48mm trocars were placed, one in the RUQ and one in the LLQ. Next, the patient was placed in trendelenberg, rotated to the left. The omentum was retracted cephalad. The cecum and appendix were identified. The base of the appendix was dissected and a window through the mesoappendix was created with blunt dissection. The mesoappendix was cut with cautery. A 64mm blue load stapler was used to cut the appendix at its base.  The appendix was placed in a specimen bag. The pelvis and RLQ were irrigated. The appendix was removed via the 12 mm trocar. 0 vicryl was used to close the fascial defect. Pneumoperitoneum was removed, all trocars were removed. All incisions were closed with 4-0 monocryl subcuticular stitch and Dermabond. The patient woke from anesthesia and was brought to PACU in stable condition.  Findings: acute appendicitis  Specimen: appendix  Blood loss: minimal  Local anesthesia: 20 ml Marcaine with Epinephrine  Complications: none  Aquilla Solian, MD [facility], [place]

## 2021-05-18 ENCOUNTER — Encounter (HOSPITAL_COMMUNITY): Payer: Self-pay | Admitting: Surgery

## 2021-05-18 MED ORDER — ONDANSETRON 4 MG PO TBDP: 4.0000 mg | ORAL_TABLET | Freq: Four times a day (QID) | ORAL | 0 refills | Status: AC | PRN

## 2021-05-18 MED ORDER — ACETAMINOPHEN 500 MG PO TABS: 1000.0000 mg | ORAL_TABLET | Freq: Three times a day (TID) | ORAL | Status: AC | PRN

## 2021-05-18 MED ORDER — OXYCODONE HCL 5 MG PO TABS: 5.0000 mg | ORAL_TABLET | Freq: Four times a day (QID) | ORAL | 0 refills | Status: AC | PRN

## 2021-05-18 MED ORDER — POLYETHYLENE GLYCOL 3350 17 G PO PACK: 17.0000 g | PACK | Freq: Every day | ORAL | 0 refills | Status: AC | PRN

## 2021-05-18 NOTE — Discharge Summary (Addendum)
Patient ID: Christina Hudson [id number] 05-Sep-1970 50 y.o.  Admit date: 05/16/2021 Discharge date: 05/18/2021  Admitting Diagnosis: Acute Appendicitis   Discharge Diagnosis Patient Active Problem List   Diagnosis Date Noted   Appendicitis 05/16/2021   Acute appendicitis 05/16/2021    Consultants None   H&P 50 yo female with 1 day of abdominal pain. Pain has been mainly in the left lower quadrant. It does not radiate. It has been constant. It is slightly worse than when it started. It is worse with movement. It is better with pain medication. She also vomited earlier today and so came to the Ed for evaluation.  Procedures Dr. Magnus Ivan - Laparoscopic Appendectomy - 05/17/2021  Hospital Course:  The patient was admitted and underwent a laparoscopic appendectomy.  The patient tolerated the procedure well.  On POD 1, the patient was tolerating a regular diet, voiding well, mobilizing, and pain was controlled with oral pain medications.  The patient was stable for DC home at this time with appropriate follow up made. Return precautions discussed.  Physical Exam: Gen:  Alert, NAD, pleasant HEENT: EOM's intact, pupils equal and round Card:  RRR, no M/G/R heard Pulm:  CTAB, no W/R/R, effort normal Abd: Soft, appropriately tender, ND, +BS, Incisions with glue intact appears well and are without drainage, bleeding, or signs of infection Ext:  No LE edema  Psych: A&Ox3  Skin: no rashes noted, warm and dry  Allergies as of 05/18/2021       Reactions   Ibuprofen Other (See Comments)   Abdominal pain   Lactose    Other reaction(s): Constipation        Medication List     STOP taking these medications    naproxen 500 MG tablet Commonly known as: NAPROSYN       TAKE these medications    acetaminophen 500 MG tablet Commonly known as: TYLENOL Take 2 tablets (1,000 mg total) by mouth every 8 (eight) hours as needed for mild pain.   buPROPion 300 MG 24 hr  tablet Commonly known as: WELLBUTRIN XL Take 300 mg by mouth daily.   calcium carbonate 1250 (500 Ca) MG chewable tablet Commonly known as: OS-CAL Chew 1 tablet by mouth daily.   cetirizine 10 MG tablet Commonly known as: ZYRTEC Take 10 mg by mouth daily as needed for allergies.   Cholecalciferol 25 MCG (1000 UT) capsule Take 1,000 Units by mouth daily.   docusate sodium 100 MG capsule Commonly known as: COLACE Take 100 mg by mouth daily as needed for mild constipation.   FeroSul 325 (65 FE) MG tablet Generic drug: ferrous sulfate Take 325 mg by mouth every other day.   FLUoxetine 20 MG capsule Commonly known as: PROZAC Take 20 mg by mouth daily.   fluticasone 50 MCG/ACT nasal spray Commonly known as: FLONASE Place 1 spray into both nostrils daily as needed for allergies.   gabapentin 300 MG capsule Commonly known as: NEURONTIN Take 300 mg by mouth 3 (three) times daily.   hydrOXYzine 25 MG capsule Commonly known as: VISTARIL Take 25 mg by mouth at bedtime.   levothyroxine 100 MCG tablet Commonly known as: SYNTHROID Take 100 mcg by mouth daily before breakfast.   Linzess 290 MCG Caps capsule Generic drug: linaclotide Take 290 mcg by mouth daily.   melatonin 3 MG Tabs tablet Take 3 mg by mouth at bedtime as needed (sleep).   ondansetron 4 MG disintegrating tablet Commonly known as: ZOFRAN-ODT Take 1 tablet (4 mg total)  by mouth every 6 (six) hours as needed for nausea.   oxyCODONE 5 MG immediate release tablet Commonly known as: Oxy IR/ROXICODONE Take 1 tablet (5 mg total) by mouth every 6 (six) hours as needed for breakthrough pain.   pantoprazole 40 MG tablet Commonly known as: PROTONIX Take 40 mg by mouth daily.   polyethylene glycol 17 g packet Commonly known as: MIRALAX / GLYCOLAX Take 17 g by mouth daily as needed for mild constipation.   Refresh 1.4-0.6 % Soln Generic drug: Polyvinyl Alcohol-Povidone PF Place 1 drop into both eyes daily as  needed (dry eyes).   triamcinolone cream 0.5 % Commonly known as: KENALOG Apply 1 application topically daily as needed (rash).   urea 20 % cream Commonly known as: CARMOL Apply 1 application topically daily.          Follow-up Information     [facility] Follow up.   Specialty: General Surgery Why: Please call to confirm your appointment time. We are working hard to make an appointment for you. Please bring a copy of your photo ID and insurance card. Please arrive 30 minutes prior to your appointment. Contact information: [address] [place] [address] [phone]                 Signed: Leary Roca, [facility] 05/18/2021, 9:54 AM Please see Amion for pager number during day hours 7:00am-4:30pm

## 2021-05-18 NOTE — Progress Notes (Signed)
Pt discharged home in stable condition 

## 2021-05-20 LAB — SURGICAL PATHOLOGY

## 2022-09-16 IMAGING — CT CT ABD-PELV W/ CM
2 of 5 series · 16 of 46 positions shown, 18 images · IV contrast (Omnipaque)
Comparison: None.

CLINICAL DATA: Abdominal pain.

EXAM:
CT ABDOMEN AND PELVIS WITH CONTRAST
TECHNIQUE: Multidetector CT imaging of the abdomen and pelvis was performed
using the standard protocol following bolus administration of
intravenous contrast.
CONTRAST:  85mL OMNIPAQUE IOHEXOL 350 MG/ML SOLN

[Series 2: axial st · axial · 0.98mm/px · z∈[-584,-179]mm · 13 of 93 slices shown, 15 images]
[im 6/93  soft-tissue]
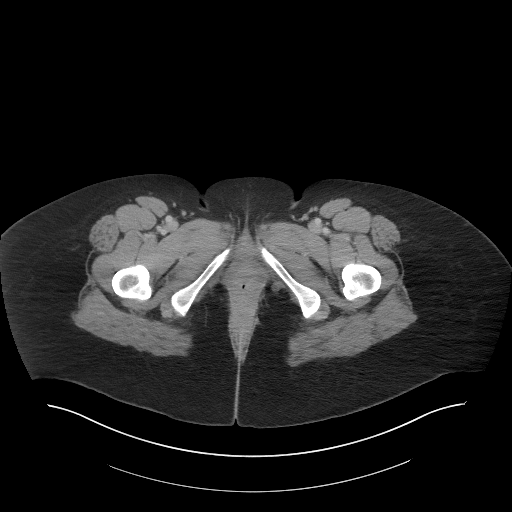
[im 6/93  bone]
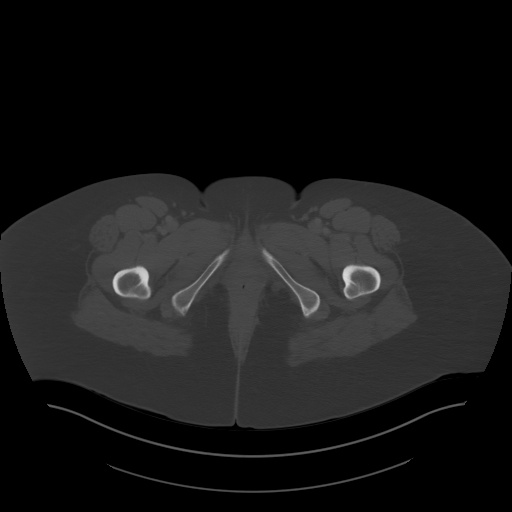
[im 11/93  soft-tissue]
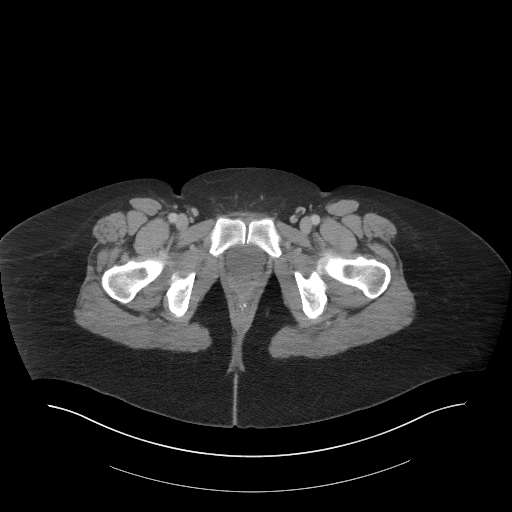
[im 22/93  soft-tissue]
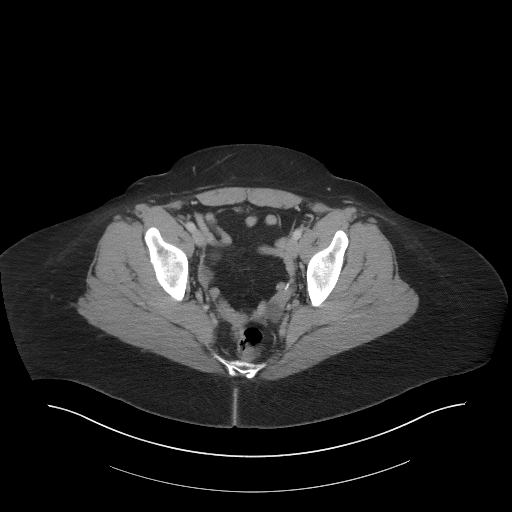
[im 28/93  soft-tissue]
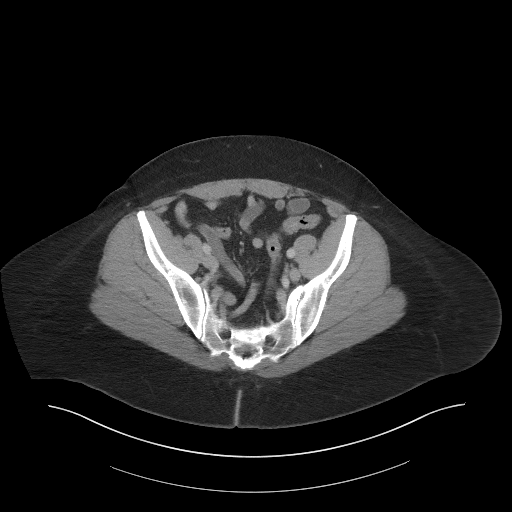
[im 33/93  soft-tissue]
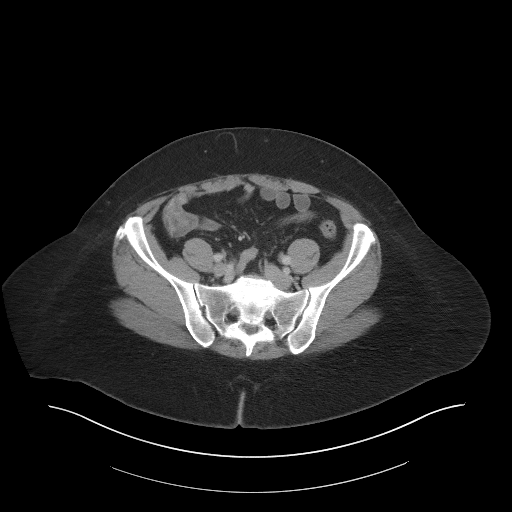
[im 38/93  soft-tissue]
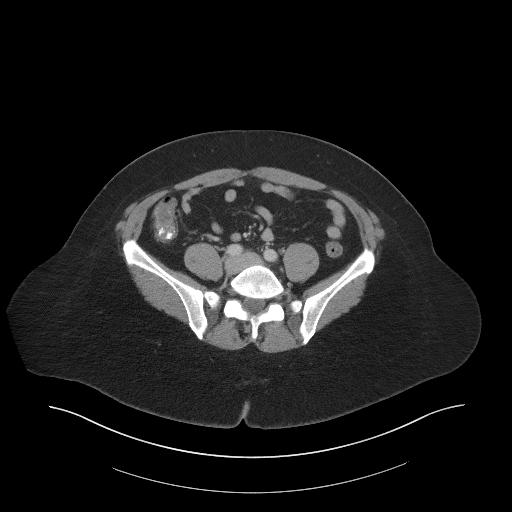
[im 49/93  soft-tissue]
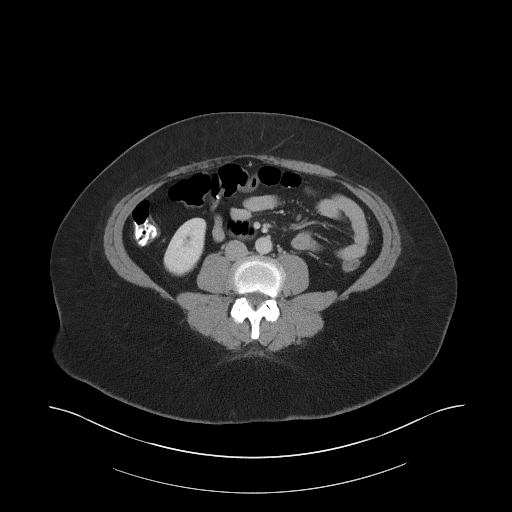
[im 55/93  soft-tissue]
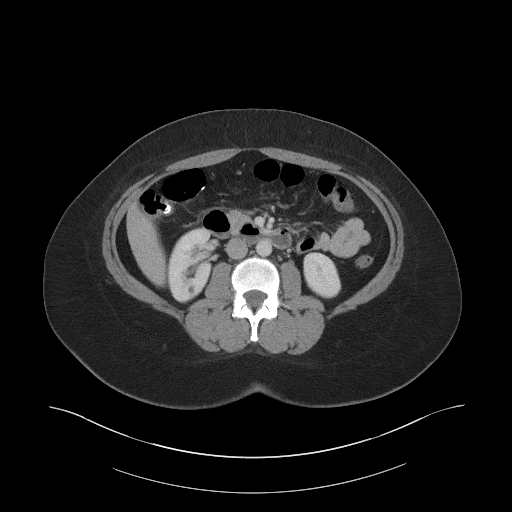
[im 60/93  soft-tissue]
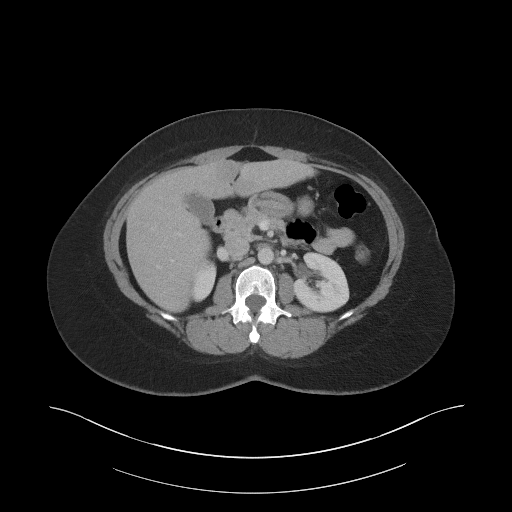
[im 60/93  bone]
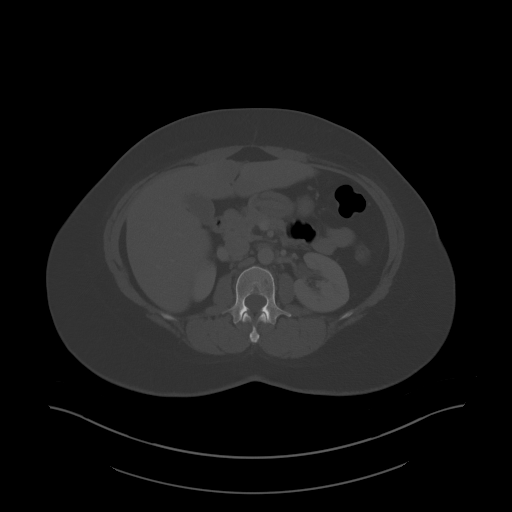
[im 65/93  soft-tissue]
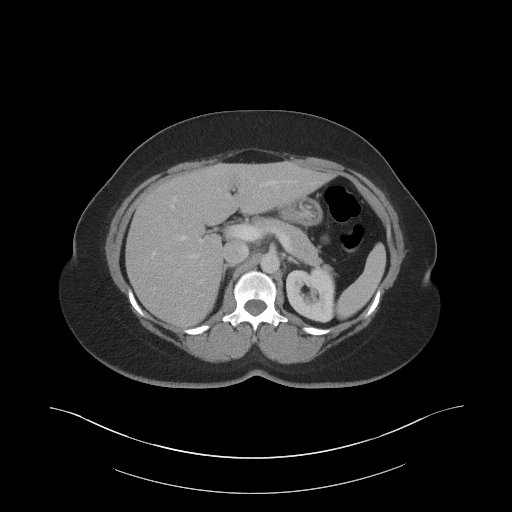
[im 71/93  soft-tissue]
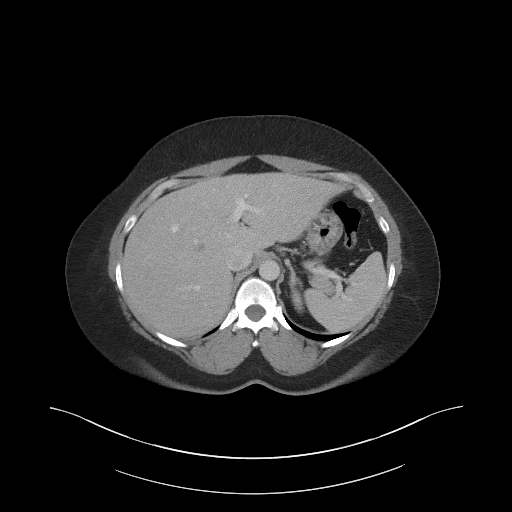
[im 82/93  soft-tissue]
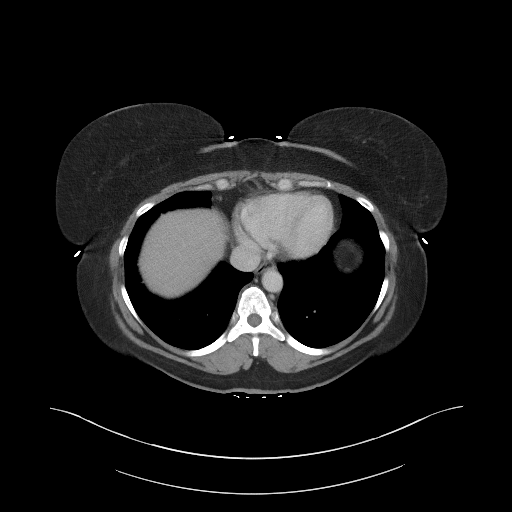
[im 87/93  soft-tissue]
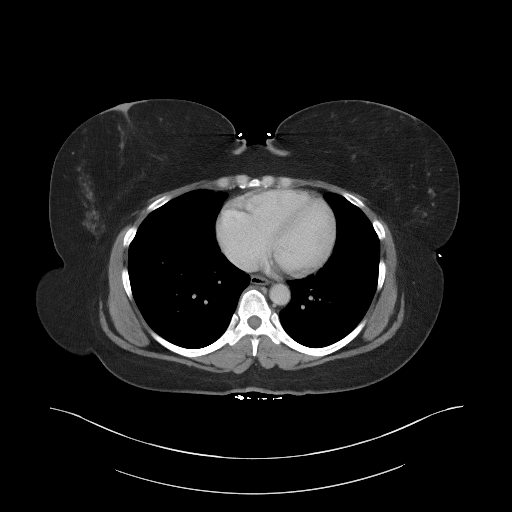

[Series 5: coronal st · coronal · 0.87mm/px · 3 of 112 slices shown]
[im 38/112  soft-tissue]
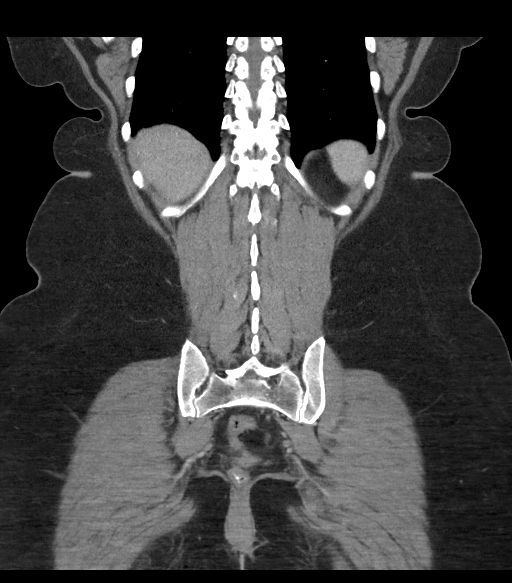
[im 50/112  soft-tissue]
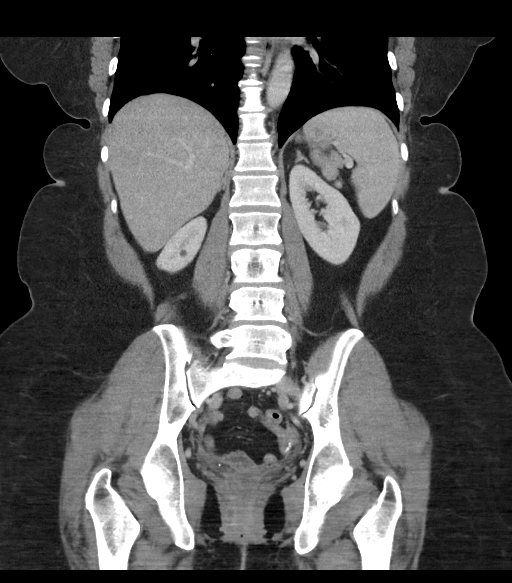
[im 62/112  soft-tissue]
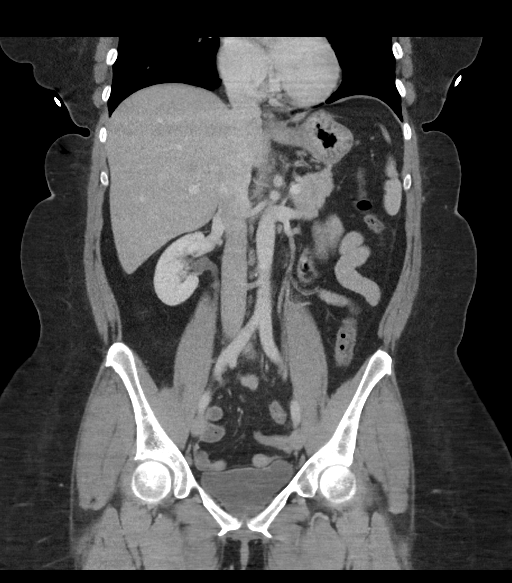

[16 of 46 positions shown; findings below may reference images not displayed]

FINDINGS: Lower chest: No acute abnormality.

Hepatobiliary: A 1.2 cm diameter cystic appearing area is seen
within the right lobe of the liver. An additional 5 mm focus of
parenchymal low attenuation is noted within the right lobe. This is
too small to characterize by CT examination. No gallstones,
gallbladder wall thickening, or biliary dilatation.

Pancreas: Unremarkable. No pancreatic ductal dilatation or
surrounding inflammatory changes.

Spleen: Normal in size without focal abnormality.

Adrenals/Urinary Tract: Adrenal glands are unremarkable. Kidneys are
normal in size, without renal calculi or hydronephrosis. A 5 mm
cystic appearing area is noted within the posterior aspect of the
mid right kidney. Bladder is unremarkable.

Stomach/Bowel: Stomach is within normal limits. A dilated
(approximately 1.2 cm), fluid-filled appendix is seen (axial CT
images 64 through 68, CT series 2). A trace amount of
periappendiceal inflammatory fat stranding is noted. Appendiceal
wall thickening is identified. There is no evidence of associated
perforation or abscess. No evidence of bowel wall thickening,
distention, or inflammatory changes.

Vascular/Lymphatic: No significant vascular findings are present. No
enlarged abdominal or pelvic lymph nodes.

Reproductive: Status post hysterectomy. No adnexal masses.

Other: No abdominal wall hernia or abnormality. No abdominopelvic
ascites.

Musculoskeletal: No acute or significant osseous findings.
IMPRESSION: 1. Findings consistent with early/mild appendicitis.
2. Small hepatic cyst versus hemangioma.

## 2022-11-28 ENCOUNTER — Emergency Department (HOSPITAL_BASED_OUTPATIENT_CLINIC_OR_DEPARTMENT_OTHER): Payer: No Typology Code available for payment source

## 2022-11-28 ENCOUNTER — Other Ambulatory Visit: Payer: Self-pay

## 2022-11-28 ENCOUNTER — Emergency Department (HOSPITAL_BASED_OUTPATIENT_CLINIC_OR_DEPARTMENT_OTHER)
Admission: EM | Admit: 2022-11-28 | Discharge: 2022-11-28 | Disposition: A | Discharge status: Home / Self Care | Payer: No Typology Code available for payment source | Attending: Emergency Medicine | Admitting: Emergency Medicine

## 2022-11-28 ENCOUNTER — Encounter (HOSPITAL_BASED_OUTPATIENT_CLINIC_OR_DEPARTMENT_OTHER): Payer: Self-pay

## 2022-11-28 DIAGNOSIS — M25512 Pain in left shoulder: Secondary | ICD-10-CM | POA: Insufficient documentation

## 2022-11-28 DIAGNOSIS — Y92410 Unspecified street and highway as the place of occurrence of the external cause: Secondary | ICD-10-CM | POA: Insufficient documentation

## 2022-11-28 DIAGNOSIS — R202 Paresthesia of skin: Secondary | ICD-10-CM | POA: Insufficient documentation
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
## 2022-11-28 NOTE — ED Notes (Signed)
Cannot discharge, registration in chart.   

## 2024-05-08 ENCOUNTER — Other Ambulatory Visit: Payer: Self-pay

## 2024-05-08 ENCOUNTER — Emergency Department (HOSPITAL_BASED_OUTPATIENT_CLINIC_OR_DEPARTMENT_OTHER)
Admission: EM | Admit: 2024-05-08 | Discharge: 2024-05-08 | Disposition: A | Discharge status: Home / Self Care | Attending: Emergency Medicine | Admitting: Emergency Medicine

## 2024-05-08 ENCOUNTER — Encounter (HOSPITAL_BASED_OUTPATIENT_CLINIC_OR_DEPARTMENT_OTHER): Payer: Self-pay | Admitting: Emergency Medicine

## 2024-05-08 DIAGNOSIS — L02412 Cutaneous abscess of left axilla: Secondary | ICD-10-CM | POA: Insufficient documentation

## 2024-05-08 DIAGNOSIS — R21 Rash and other nonspecific skin eruption: Secondary | ICD-10-CM

## 2024-05-08 MED ORDER — PREDNISONE 20 MG PO TABS: 40.0000 mg | ORAL_TABLET | Freq: Every day | ORAL | 0 refills | Status: AC

## 2024-05-08 NOTE — Discharge Instructions (Signed)
 You were put on a short course of steroids, please take 2 tablets daily for the next 5 days.  You experience any fever, systemic signs please return to the emergency department.

## 2024-05-08 NOTE — ED Provider Notes (Signed)
 [place] [facility] AT [facility] Provider Note   CSN: [id number] Arrival date & time: 05/08/24  1058     Patient presents with: Rash   Christina Hudson is a 53 y.o. female.   53 year old female with a past medical history of GERD presents to the ED with a chief complaint of rash under her armpit that has been ongoing for a couple of weeks.  Reports that the spots turned into small blisters, they later have skin sloughed off and turned dark.  She was evaluated at the [place] on Friday, placed on antifungal cream.  She is here today as itching has worsened, she does feel that there is more of a rash spreading down to her thighs along with her back.  She says the first lesion started with under the right breast, she was told it was a pressure ulcer .  However now this appears to be small.  No systemic signs such as fever, abdominal pain, oral involvement.  The history is provided by the patient.  Rash Associated symptoms: no fever        Prior to Admission medications   Medication Sig Start Date End Date Taking? Authorizing Provider  acetaminophen  (TYLENOL ) 500 MG tablet Take 2 tablets (1,000 mg total) by mouth every 8 (eight) hours as needed for mild pain. 05/18/21   Maczis, Michael M, PA-C  buPROPion (WELLBUTRIN XL) 300 MG 24 hr tablet Take 300 mg by mouth daily. 08/08/19   [provider]  calcium carbonate (OS-CAL) 1250 (500 Ca) MG chewable tablet Chew 1 tablet by mouth daily. 12/24/20 12/25/21  [provider]  cetirizine (ZYRTEC) 10 MG tablet Take 10 mg by mouth daily as needed for allergies. 12/24/20 12/25/21  [provider]  Cholecalciferol 25 MCG (1000 UT) capsule Take 1,000 Units by mouth daily.    [provider]  docusate sodium (COLACE) 100 MG capsule Take 100 mg by mouth daily as needed for mild constipation. 04/23/21   [provider]  FEROSUL 325 (65 Fe) MG tablet Take 325 mg by mouth every other day. 04/23/21    [provider]  FLUoxetine (PROZAC) 20 MG capsule Take 20 mg by mouth daily. 05/10/21   [provider]  fluticasone (FLONASE) 50 MCG/ACT nasal spray Place 1 spray into both nostrils daily as needed for allergies. 04/23/21   [provider]  gabapentin (NEURONTIN) 300 MG capsule Take 300 mg by mouth 3 (three) times daily. 09/15/19 12/25/21  [provider]  levothyroxine (SYNTHROID) 100 MCG tablet Take 100 mcg by mouth daily before breakfast. 03/16/20   [provider]  LINZESS 290 MCG CAPS capsule Take 290 mcg by mouth daily. 03/18/21   [provider]  melatonin 3 MG TABS tablet Take 3 mg by mouth at bedtime as needed (sleep). 07/01/19   [provider]  ondansetron  (ZOFRAN -ODT) 4 MG disintegrating tablet Take 1 tablet (4 mg total) by mouth every 6 (six) hours as needed for nausea. 05/18/21   Maczis, Michael M, PA-C  oxyCODONE  (OXY IR/ROXICODONE ) 5 MG immediate release tablet Take 1 tablet (5 mg total) by mouth every 6 (six) hours as needed for breakthrough pain. 05/18/21   Maczis, Michael M, PA-C  pantoprazole (PROTONIX) 40 MG tablet Take 40 mg by mouth daily. 04/23/21   [provider]  polyethylene glycol (MIRALAX  / GLYCOLAX ) 17 g packet Take 17 g by mouth daily as needed for mild constipation. 05/18/21   Maczis, Michael M, PA-C  Polyvinyl  Alcohol-Povidone PF (REFRESH) 1.4-0.6 % SOLN Place 1 drop into both eyes daily as needed (dry eyes).    [provider]  predniSONE  (DELTASONE ) 20 MG tablet Take 2 tablets (40 mg total) by mouth daily for 5 days. 05/08/24 05/13/24 Yes Jaye Saal, PA-C  triamcinolone cream (KENALOG) 0.5 % Apply 1 application topically daily as needed (rash). 03/18/21   [provider]  urea (CARMOL) 20 % cream Apply 1 application topically daily. 04/23/21   [provider]    Allergies: Ibuprofen and Lactose    Review of Systems  Constitutional:  Negative for fever.  Skin:  Positive for  rash.    Updated Vital Signs BP 126/69 (BP Location: Right Arm)   Pulse 79   Temp 98.6 F (37 C) (Oral)   Resp 15   Ht 5' 4.5 (1.638 m)   Wt 99.8 kg   SpO2 98%   BMI 37.18 kg/m   Physical Exam Vitals and nursing note reviewed.  Constitutional:      Appearance: Normal appearance.  HENT:     Head: Normocephalic and atraumatic.     Mouth/Throat:     Mouth: Mucous membranes are moist.  Eyes:     General: No scleral icterus. Cardiovascular:     Rate and Rhythm: Normal rate.  Pulmonary:     Effort: Pulmonary effort is normal.  Abdominal:     General: Abdomen is flat.  Musculoskeletal:     Cervical back: Normal range of motion and neck supple.  Skin:    General: Skin is warm and dry.     Findings: Rash present.     Comments: Scatter circular pinpoint lesions noted under the left armpit, the right armpit.  Nonfluctuant in nature, they do appear to look like small wounds.  Also some noted under the right breast.  Others do appear dark.  Neurological:     Mental Status: She is alert and oriented to person, place, and time.     (all labs ordered are listed, but only abnormal results are displayed) Labs Reviewed - No data to display  EKG: None  Radiology: No results found.   Procedures   Medications Ordered in the ED - No data to display                                  Medical Decision Making Risk Prescription drug management.   Patient presented to the ED with a rash has been ongoing for several days.  Evaluated at the [place] on Friday diagnosed with a fungal infection.  Was given a cream, that she has been applying now however rash has seemed to spread.  She tells that she was likely told that it was from sweat as it was red under her right breast.  She continues to complain of itchiness, has been taking Benadryl but this has been making her really sleepy.  She has does not have any systemic signs no fever, no abdominal pain, no nausea, no vomiting.  She is  concerned as now the rash seems to be spreading.  She has never had any rash like this before.  She does not have any history of kidney disease, not on any dialysis.  She has not been running any fever, I do not suspect any systemic infection at this time.  No mucosal involvement to suggest [place] versus [facility].  Is hemodynamically stable.  She is` agreeable to plan and treatment.  stable for discharge.    Portions of this note were generated with Scientist, clinical (histocompatibility and immunogenetics). Dictation errors may occur despite best attempts at proofreading.   Final diagnoses:  Rash    ED Discharge Orders          Ordered    predniSONE  (DELTASONE ) 20 MG tablet  Daily        05/08/24 1302               Delmer Kowalski, PA-C 05/08/24 1311    Cottie Donnice PARAS, MD 05/08/24 1320

## 2024-05-08 NOTE — ED Triage Notes (Signed)
 Pt with multiple round-shaped areas to bil axillae, under breasts, on abd; they started out red, then turned black and now starting to blister; she c/o itching and burning
